# Patient Record
Sex: Male | Born: 1945 | Race: White | Hispanic: No | Marital: Married | Smoking: Former smoker
Health system: Southern US, Community
[De-identification: ages and names within clinical notes are randomized; demographics above are authoritative.]

## PROBLEM LIST (undated history)

## (undated) DIAGNOSIS — R569 Unspecified convulsions: Secondary | ICD-10-CM

## (undated) DIAGNOSIS — R413 Other amnesia: Secondary | ICD-10-CM

## (undated) DIAGNOSIS — M1990 Unspecified osteoarthritis, unspecified site: Secondary | ICD-10-CM

## (undated) HISTORY — DX: Unspecified convulsions: R56.9

## (undated) HISTORY — PX: SHOULDER SURGERY: SHX246

## (undated) HISTORY — DX: Unspecified osteoarthritis, unspecified site: M19.90

## (undated) HISTORY — DX: Other amnesia: R41.3

---

## 2003-12-03 ENCOUNTER — Ambulatory Visit (HOSPITAL_COMMUNITY): Admission: RE | Admit: 2003-12-03 | Discharge: 2003-12-03 | Payer: Self-pay | Admitting: Orthopedic Surgery

## 2005-10-21 IMAGING — CR DG ORBITS FOR FOREIGN BODY
2 series · 2 of 2 positions shown · non-contrast
Comparison: none

CLINICAL DATA: Pre-MRI/metal clearance. 

  ORBITS FOR FOREIGN BODY (TWO VIEWS)
 There is no evidence of metal or radiopaque foreign body within the orbits. No significant bone or soft tissue abnormalities are identified.
 IMPRESSION
 No evidence of metallic foreign body.

[view not recorded (1 of 2)]
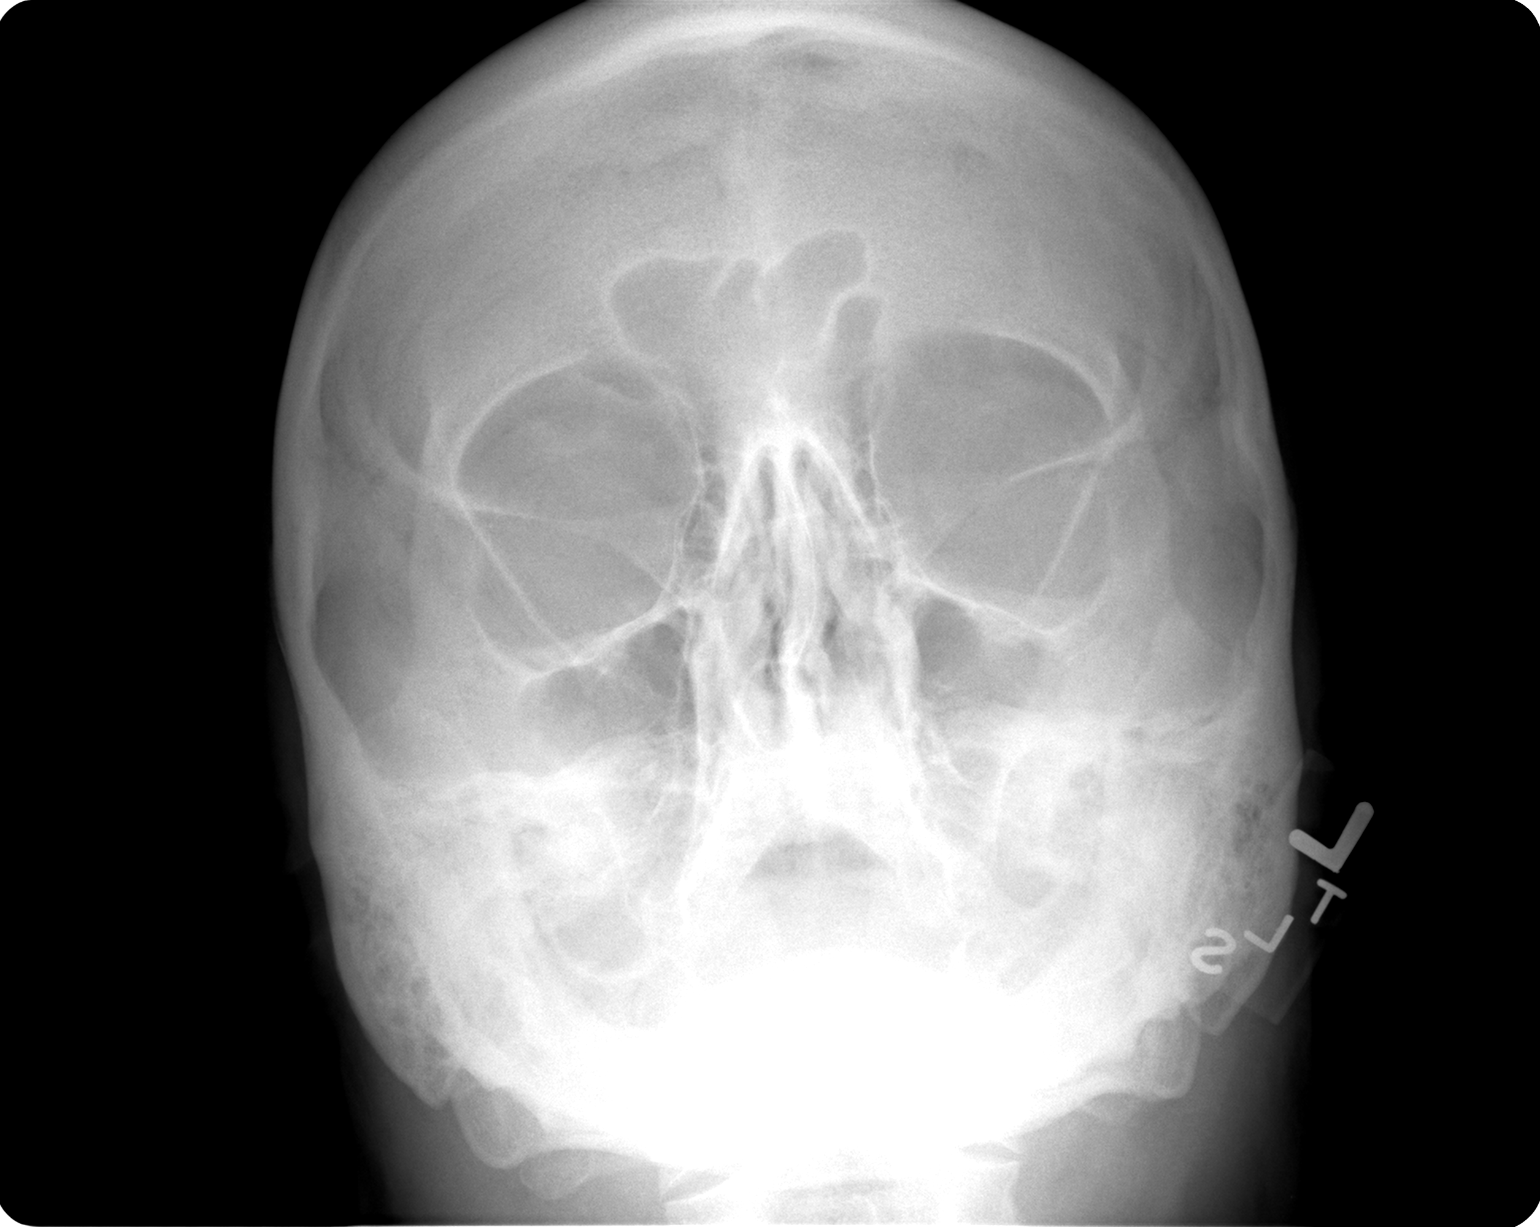

[view not recorded (2 of 2)]
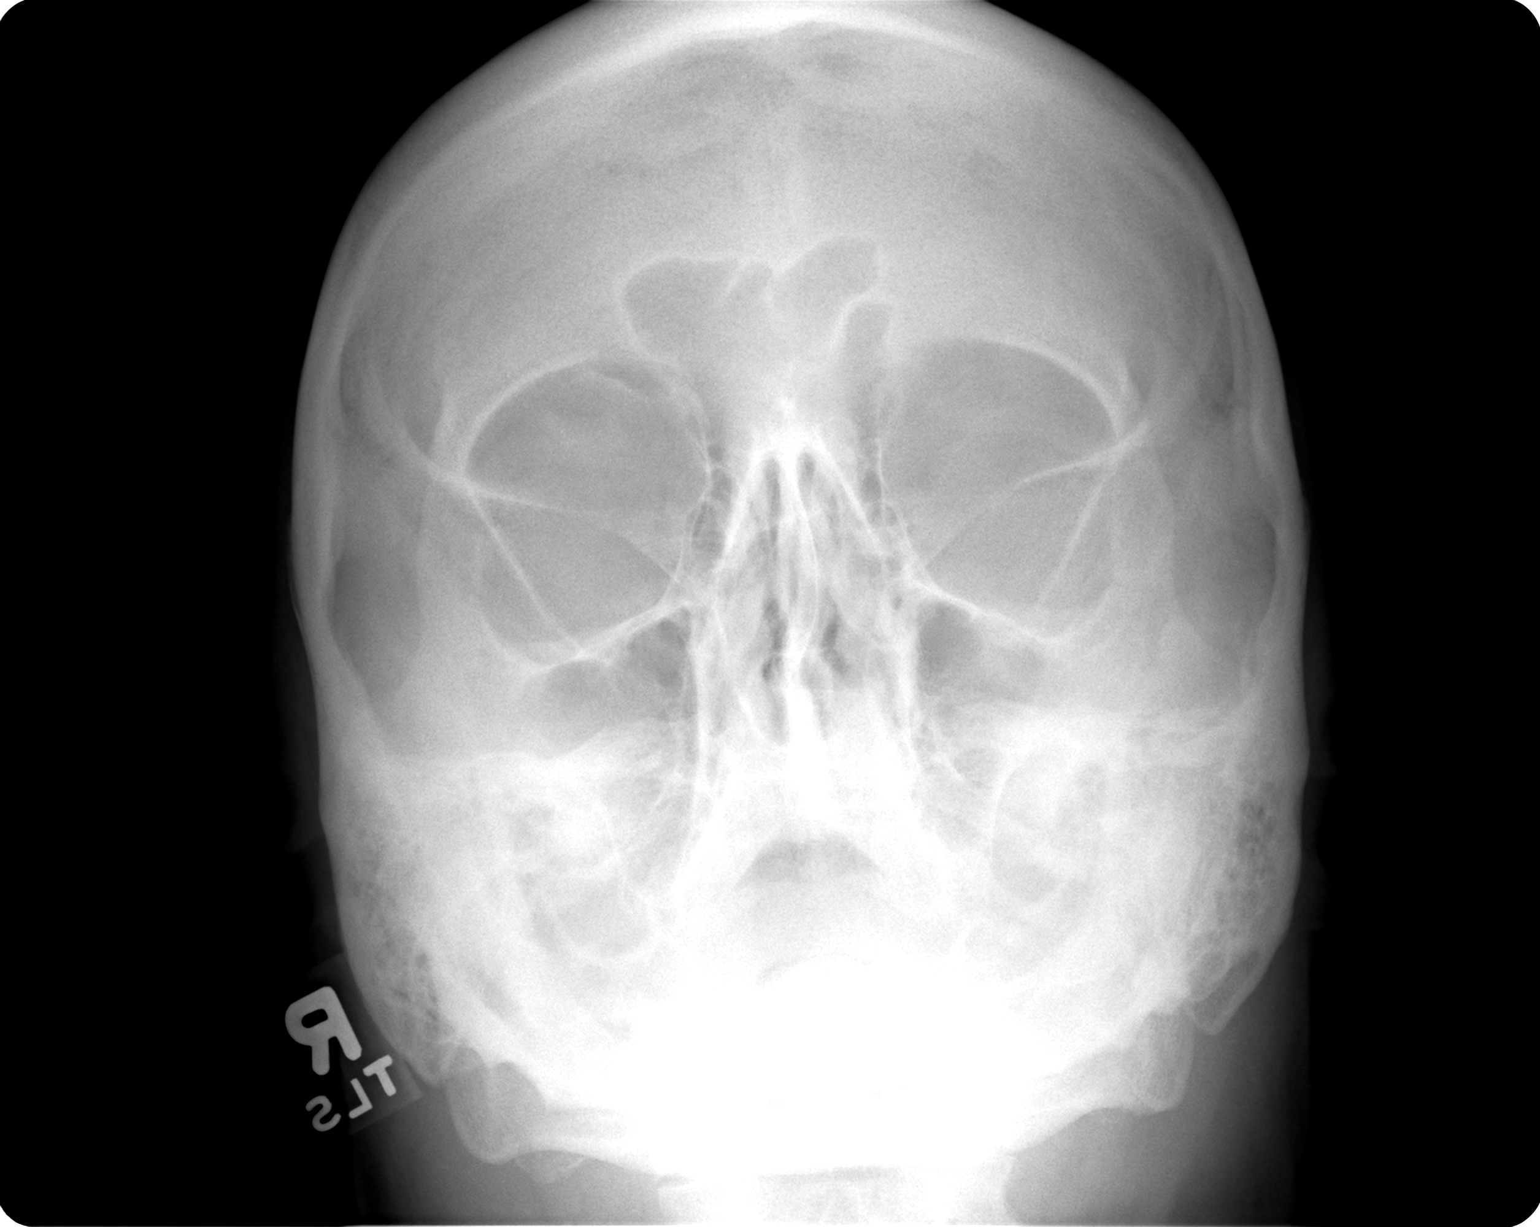

[2 of 2 positions shown; findings below may reference images not displayed]

## 2015-07-26 DIAGNOSIS — J069 Acute upper respiratory infection, unspecified: Secondary | ICD-10-CM | POA: Diagnosis not present

## 2015-07-26 DIAGNOSIS — R062 Wheezing: Secondary | ICD-10-CM | POA: Diagnosis not present

## 2015-07-26 DIAGNOSIS — J189 Pneumonia, unspecified organism: Secondary | ICD-10-CM | POA: Diagnosis not present

## 2015-07-26 DIAGNOSIS — H6123 Impacted cerumen, bilateral: Secondary | ICD-10-CM | POA: Diagnosis not present

## 2015-09-06 DIAGNOSIS — J181 Lobar pneumonia, unspecified organism: Secondary | ICD-10-CM | POA: Diagnosis not present

## 2015-09-17 DIAGNOSIS — R062 Wheezing: Secondary | ICD-10-CM | POA: Diagnosis not present

## 2015-09-17 DIAGNOSIS — J209 Acute bronchitis, unspecified: Secondary | ICD-10-CM | POA: Diagnosis not present

## 2015-10-28 DIAGNOSIS — I129 Hypertensive chronic kidney disease with stage 1 through stage 4 chronic kidney disease, or unspecified chronic kidney disease: Secondary | ICD-10-CM | POA: Diagnosis not present

## 2015-10-28 DIAGNOSIS — R5381 Other malaise: Secondary | ICD-10-CM | POA: Diagnosis not present

## 2015-10-28 DIAGNOSIS — R0989 Other specified symptoms and signs involving the circulatory and respiratory systems: Secondary | ICD-10-CM | POA: Diagnosis not present

## 2015-10-28 DIAGNOSIS — E039 Hypothyroidism, unspecified: Secondary | ICD-10-CM | POA: Diagnosis not present

## 2015-10-28 DIAGNOSIS — N183 Chronic kidney disease, stage 3 (moderate): Secondary | ICD-10-CM | POA: Diagnosis not present

## 2015-10-28 DIAGNOSIS — Z6829 Body mass index (BMI) 29.0-29.9, adult: Secondary | ICD-10-CM | POA: Diagnosis not present

## 2015-10-28 DIAGNOSIS — E782 Mixed hyperlipidemia: Secondary | ICD-10-CM | POA: Diagnosis not present

## 2015-10-28 DIAGNOSIS — R419 Unspecified symptoms and signs involving cognitive functions and awareness: Secondary | ICD-10-CM | POA: Diagnosis not present

## 2015-10-28 DIAGNOSIS — I7090 Unspecified atherosclerosis: Secondary | ICD-10-CM | POA: Diagnosis not present

## 2015-10-28 DIAGNOSIS — R5383 Other fatigue: Secondary | ICD-10-CM | POA: Diagnosis not present

## 2015-10-28 DIAGNOSIS — R413 Other amnesia: Secondary | ICD-10-CM | POA: Diagnosis not present

## 2015-10-28 DIAGNOSIS — E1122 Type 2 diabetes mellitus with diabetic chronic kidney disease: Secondary | ICD-10-CM | POA: Diagnosis not present

## 2015-10-28 DIAGNOSIS — E663 Overweight: Secondary | ICD-10-CM | POA: Diagnosis not present

## 2015-11-11 DIAGNOSIS — D519 Vitamin B12 deficiency anemia, unspecified: Secondary | ICD-10-CM | POA: Diagnosis not present

## 2015-11-11 DIAGNOSIS — D649 Anemia, unspecified: Secondary | ICD-10-CM | POA: Diagnosis not present

## 2015-11-25 DIAGNOSIS — I129 Hypertensive chronic kidney disease with stage 1 through stage 4 chronic kidney disease, or unspecified chronic kidney disease: Secondary | ICD-10-CM | POA: Diagnosis not present

## 2015-11-25 DIAGNOSIS — Z79899 Other long term (current) drug therapy: Secondary | ICD-10-CM | POA: Diagnosis not present

## 2015-11-25 DIAGNOSIS — R413 Other amnesia: Secondary | ICD-10-CM | POA: Diagnosis not present

## 2015-11-25 DIAGNOSIS — D509 Iron deficiency anemia, unspecified: Secondary | ICD-10-CM | POA: Diagnosis not present

## 2015-11-25 DIAGNOSIS — E782 Mixed hyperlipidemia: Secondary | ICD-10-CM | POA: Diagnosis not present

## 2015-11-25 DIAGNOSIS — R5381 Other malaise: Secondary | ICD-10-CM | POA: Diagnosis not present

## 2015-11-25 DIAGNOSIS — R5383 Other fatigue: Secondary | ICD-10-CM | POA: Diagnosis not present

## 2015-11-25 DIAGNOSIS — N183 Chronic kidney disease, stage 3 (moderate): Secondary | ICD-10-CM | POA: Diagnosis not present

## 2015-11-25 DIAGNOSIS — E663 Overweight: Secondary | ICD-10-CM | POA: Diagnosis not present

## 2015-11-25 DIAGNOSIS — E039 Hypothyroidism, unspecified: Secondary | ICD-10-CM | POA: Diagnosis not present

## 2015-11-25 DIAGNOSIS — Z6828 Body mass index (BMI) 28.0-28.9, adult: Secondary | ICD-10-CM | POA: Diagnosis not present

## 2015-11-25 DIAGNOSIS — E1122 Type 2 diabetes mellitus with diabetic chronic kidney disease: Secondary | ICD-10-CM | POA: Diagnosis not present

## 2015-11-29 DIAGNOSIS — R194 Change in bowel habit: Secondary | ICD-10-CM | POA: Diagnosis not present

## 2015-11-29 DIAGNOSIS — D509 Iron deficiency anemia, unspecified: Secondary | ICD-10-CM | POA: Diagnosis not present

## 2015-12-07 DIAGNOSIS — D509 Iron deficiency anemia, unspecified: Secondary | ICD-10-CM | POA: Diagnosis not present

## 2015-12-07 DIAGNOSIS — Z0000 Encounter for general adult medical examination without abnormal findings: Secondary | ICD-10-CM | POA: Diagnosis not present

## 2015-12-07 DIAGNOSIS — D123 Benign neoplasm of transverse colon: Secondary | ICD-10-CM | POA: Diagnosis not present

## 2016-01-29 DIAGNOSIS — R1031 Right lower quadrant pain: Secondary | ICD-10-CM | POA: Diagnosis not present

## 2016-01-29 DIAGNOSIS — K8020 Calculus of gallbladder without cholecystitis without obstruction: Secondary | ICD-10-CM | POA: Diagnosis not present

## 2016-01-29 DIAGNOSIS — K5732 Diverticulitis of large intestine without perforation or abscess without bleeding: Secondary | ICD-10-CM | POA: Diagnosis not present

## 2016-01-31 DIAGNOSIS — J323 Chronic sphenoidal sinusitis: Secondary | ICD-10-CM | POA: Diagnosis not present

## 2016-01-31 DIAGNOSIS — R413 Other amnesia: Secondary | ICD-10-CM | POA: Diagnosis not present

## 2016-02-04 DIAGNOSIS — K5732 Diverticulitis of large intestine without perforation or abscess without bleeding: Secondary | ICD-10-CM | POA: Diagnosis not present

## 2016-02-04 DIAGNOSIS — R1031 Right lower quadrant pain: Secondary | ICD-10-CM | POA: Diagnosis not present

## 2016-02-04 DIAGNOSIS — K5792 Diverticulitis of intestine, part unspecified, without perforation or abscess without bleeding: Secondary | ICD-10-CM | POA: Diagnosis not present

## 2016-02-08 DIAGNOSIS — F0280 Dementia in other diseases classified elsewhere without behavioral disturbance: Secondary | ICD-10-CM | POA: Diagnosis not present

## 2016-02-08 DIAGNOSIS — K5792 Diverticulitis of intestine, part unspecified, without perforation or abscess without bleeding: Secondary | ICD-10-CM | POA: Diagnosis not present

## 2016-02-08 DIAGNOSIS — D509 Iron deficiency anemia, unspecified: Secondary | ICD-10-CM | POA: Diagnosis not present

## 2016-02-08 DIAGNOSIS — G300 Alzheimer's disease with early onset: Secondary | ICD-10-CM | POA: Diagnosis not present

## 2016-02-08 DIAGNOSIS — Z79899 Other long term (current) drug therapy: Secondary | ICD-10-CM | POA: Diagnosis not present

## 2016-02-28 DIAGNOSIS — D649 Anemia, unspecified: Secondary | ICD-10-CM | POA: Diagnosis not present

## 2016-02-28 DIAGNOSIS — K5730 Diverticulosis of large intestine without perforation or abscess without bleeding: Secondary | ICD-10-CM | POA: Diagnosis not present

## 2016-02-28 DIAGNOSIS — K8020 Calculus of gallbladder without cholecystitis without obstruction: Secondary | ICD-10-CM | POA: Diagnosis not present

## 2016-03-20 DIAGNOSIS — K648 Other hemorrhoids: Secondary | ICD-10-CM | POA: Diagnosis not present

## 2016-03-20 DIAGNOSIS — R194 Change in bowel habit: Secondary | ICD-10-CM | POA: Diagnosis not present

## 2016-03-20 DIAGNOSIS — K5730 Diverticulosis of large intestine without perforation or abscess without bleeding: Secondary | ICD-10-CM | POA: Diagnosis not present

## 2016-03-20 DIAGNOSIS — R933 Abnormal findings on diagnostic imaging of other parts of digestive tract: Secondary | ICD-10-CM | POA: Diagnosis not present

## 2016-03-27 DIAGNOSIS — R938 Abnormal findings on diagnostic imaging of other specified body structures: Secondary | ICD-10-CM | POA: Diagnosis not present

## 2016-04-09 DIAGNOSIS — K7689 Other specified diseases of liver: Secondary | ICD-10-CM | POA: Diagnosis not present

## 2016-04-09 DIAGNOSIS — K8020 Calculus of gallbladder without cholecystitis without obstruction: Secondary | ICD-10-CM | POA: Diagnosis not present

## 2016-04-10 DIAGNOSIS — G300 Alzheimer's disease with early onset: Secondary | ICD-10-CM | POA: Diagnosis not present

## 2016-04-10 DIAGNOSIS — F0280 Dementia in other diseases classified elsewhere without behavioral disturbance: Secondary | ICD-10-CM | POA: Diagnosis not present

## 2016-04-10 DIAGNOSIS — Z79899 Other long term (current) drug therapy: Secondary | ICD-10-CM | POA: Diagnosis not present

## 2016-04-10 DIAGNOSIS — E782 Mixed hyperlipidemia: Secondary | ICD-10-CM | POA: Diagnosis not present

## 2016-04-10 DIAGNOSIS — I129 Hypertensive chronic kidney disease with stage 1 through stage 4 chronic kidney disease, or unspecified chronic kidney disease: Secondary | ICD-10-CM | POA: Diagnosis not present

## 2016-04-10 DIAGNOSIS — N183 Chronic kidney disease, stage 3 (moderate): Secondary | ICD-10-CM | POA: Diagnosis not present

## 2016-04-10 DIAGNOSIS — D509 Iron deficiency anemia, unspecified: Secondary | ICD-10-CM | POA: Diagnosis not present

## 2016-04-10 DIAGNOSIS — E039 Hypothyroidism, unspecified: Secondary | ICD-10-CM | POA: Diagnosis not present

## 2016-04-10 DIAGNOSIS — I7090 Unspecified atherosclerosis: Secondary | ICD-10-CM | POA: Diagnosis not present

## 2016-04-10 DIAGNOSIS — E1122 Type 2 diabetes mellitus with diabetic chronic kidney disease: Secondary | ICD-10-CM | POA: Diagnosis not present

## 2016-08-10 DIAGNOSIS — N183 Chronic kidney disease, stage 3 (moderate): Secondary | ICD-10-CM | POA: Diagnosis not present

## 2016-08-10 DIAGNOSIS — G300 Alzheimer's disease with early onset: Secondary | ICD-10-CM | POA: Diagnosis not present

## 2016-08-10 DIAGNOSIS — E1122 Type 2 diabetes mellitus with diabetic chronic kidney disease: Secondary | ICD-10-CM | POA: Diagnosis not present

## 2016-08-10 DIAGNOSIS — E782 Mixed hyperlipidemia: Secondary | ICD-10-CM | POA: Diagnosis not present

## 2016-08-10 DIAGNOSIS — E039 Hypothyroidism, unspecified: Secondary | ICD-10-CM | POA: Diagnosis not present

## 2016-08-10 DIAGNOSIS — F0280 Dementia in other diseases classified elsewhere without behavioral disturbance: Secondary | ICD-10-CM | POA: Diagnosis not present

## 2016-08-10 DIAGNOSIS — I7090 Unspecified atherosclerosis: Secondary | ICD-10-CM | POA: Diagnosis not present

## 2016-08-10 DIAGNOSIS — I129 Hypertensive chronic kidney disease with stage 1 through stage 4 chronic kidney disease, or unspecified chronic kidney disease: Secondary | ICD-10-CM | POA: Diagnosis not present

## 2016-08-10 DIAGNOSIS — Z79899 Other long term (current) drug therapy: Secondary | ICD-10-CM | POA: Diagnosis not present

## 2016-08-10 DIAGNOSIS — D509 Iron deficiency anemia, unspecified: Secondary | ICD-10-CM | POA: Diagnosis not present

## 2017-02-14 DIAGNOSIS — R5383 Other fatigue: Secondary | ICD-10-CM | POA: Diagnosis not present

## 2017-02-14 DIAGNOSIS — D509 Iron deficiency anemia, unspecified: Secondary | ICD-10-CM | POA: Diagnosis not present

## 2017-02-14 DIAGNOSIS — I129 Hypertensive chronic kidney disease with stage 1 through stage 4 chronic kidney disease, or unspecified chronic kidney disease: Secondary | ICD-10-CM | POA: Diagnosis not present

## 2017-02-14 DIAGNOSIS — I7090 Unspecified atherosclerosis: Secondary | ICD-10-CM | POA: Diagnosis not present

## 2017-02-14 DIAGNOSIS — G8929 Other chronic pain: Secondary | ICD-10-CM | POA: Diagnosis not present

## 2017-02-14 DIAGNOSIS — M545 Low back pain: Secondary | ICD-10-CM | POA: Diagnosis not present

## 2017-02-14 DIAGNOSIS — E1122 Type 2 diabetes mellitus with diabetic chronic kidney disease: Secondary | ICD-10-CM | POA: Diagnosis not present

## 2017-02-14 DIAGNOSIS — Z79899 Other long term (current) drug therapy: Secondary | ICD-10-CM | POA: Diagnosis not present

## 2017-02-14 DIAGNOSIS — N183 Chronic kidney disease, stage 3 (moderate): Secondary | ICD-10-CM | POA: Diagnosis not present

## 2017-02-14 DIAGNOSIS — R5381 Other malaise: Secondary | ICD-10-CM | POA: Diagnosis not present

## 2017-03-04 DIAGNOSIS — S46812A Strain of other muscles, fascia and tendons at shoulder and upper arm level, left arm, initial encounter: Secondary | ICD-10-CM | POA: Diagnosis not present

## 2017-03-04 DIAGNOSIS — F0390 Unspecified dementia without behavioral disturbance: Secondary | ICD-10-CM | POA: Diagnosis not present

## 2017-03-04 DIAGNOSIS — M25512 Pain in left shoulder: Secondary | ICD-10-CM | POA: Diagnosis not present

## 2017-03-04 DIAGNOSIS — I10 Essential (primary) hypertension: Secondary | ICD-10-CM | POA: Diagnosis not present

## 2017-03-04 DIAGNOSIS — M546 Pain in thoracic spine: Secondary | ICD-10-CM | POA: Diagnosis not present

## 2017-03-04 DIAGNOSIS — Z79899 Other long term (current) drug therapy: Secondary | ICD-10-CM | POA: Diagnosis not present

## 2017-03-04 DIAGNOSIS — R0602 Shortness of breath: Secondary | ICD-10-CM | POA: Diagnosis not present

## 2017-04-16 DIAGNOSIS — I129 Hypertensive chronic kidney disease with stage 1 through stage 4 chronic kidney disease, or unspecified chronic kidney disease: Secondary | ICD-10-CM | POA: Diagnosis not present

## 2017-04-16 DIAGNOSIS — E1122 Type 2 diabetes mellitus with diabetic chronic kidney disease: Secondary | ICD-10-CM | POA: Diagnosis not present

## 2017-04-16 DIAGNOSIS — N183 Chronic kidney disease, stage 3 (moderate): Secondary | ICD-10-CM | POA: Diagnosis not present

## 2017-04-16 DIAGNOSIS — Z79899 Other long term (current) drug therapy: Secondary | ICD-10-CM | POA: Diagnosis not present

## 2017-06-19 DIAGNOSIS — G300 Alzheimer's disease with early onset: Secondary | ICD-10-CM | POA: Diagnosis not present

## 2017-06-19 DIAGNOSIS — E1122 Type 2 diabetes mellitus with diabetic chronic kidney disease: Secondary | ICD-10-CM | POA: Diagnosis not present

## 2017-06-19 DIAGNOSIS — D509 Iron deficiency anemia, unspecified: Secondary | ICD-10-CM | POA: Diagnosis not present

## 2017-06-19 DIAGNOSIS — N183 Chronic kidney disease, stage 3 (moderate): Secondary | ICD-10-CM | POA: Diagnosis not present

## 2017-06-19 DIAGNOSIS — E039 Hypothyroidism, unspecified: Secondary | ICD-10-CM | POA: Diagnosis not present

## 2017-06-19 DIAGNOSIS — I129 Hypertensive chronic kidney disease with stage 1 through stage 4 chronic kidney disease, or unspecified chronic kidney disease: Secondary | ICD-10-CM | POA: Diagnosis not present

## 2017-06-19 DIAGNOSIS — E782 Mixed hyperlipidemia: Secondary | ICD-10-CM | POA: Diagnosis not present

## 2017-08-23 DIAGNOSIS — M542 Cervicalgia: Secondary | ICD-10-CM | POA: Diagnosis not present

## 2017-08-23 DIAGNOSIS — M545 Low back pain: Secondary | ICD-10-CM | POA: Diagnosis not present

## 2017-08-23 DIAGNOSIS — M25512 Pain in left shoulder: Secondary | ICD-10-CM | POA: Diagnosis not present

## 2017-08-23 DIAGNOSIS — M25562 Pain in left knee: Secondary | ICD-10-CM | POA: Diagnosis not present

## 2017-08-23 DIAGNOSIS — M25561 Pain in right knee: Secondary | ICD-10-CM | POA: Diagnosis not present

## 2017-08-31 DIAGNOSIS — M542 Cervicalgia: Secondary | ICD-10-CM | POA: Diagnosis not present

## 2017-09-06 DIAGNOSIS — M25512 Pain in left shoulder: Secondary | ICD-10-CM | POA: Diagnosis not present

## 2017-09-06 DIAGNOSIS — R569 Unspecified convulsions: Secondary | ICD-10-CM | POA: Diagnosis not present

## 2017-09-13 DIAGNOSIS — R569 Unspecified convulsions: Secondary | ICD-10-CM | POA: Diagnosis not present

## 2017-09-20 DIAGNOSIS — R569 Unspecified convulsions: Secondary | ICD-10-CM | POA: Diagnosis not present

## 2017-09-25 ENCOUNTER — Telehealth: Payer: Self-pay | Admitting: Neurology

## 2017-09-25 ENCOUNTER — Encounter: Payer: Self-pay | Admitting: Neurology

## 2017-09-25 ENCOUNTER — Ambulatory Visit (INDEPENDENT_AMBULATORY_CARE_PROVIDER_SITE_OTHER): Payer: PPO | Admitting: Neurology

## 2017-09-25 VITALS — BP 180/94 | HR 66 | Ht 70.0 in | Wt 196.0 lb

## 2017-09-25 DIAGNOSIS — R569 Unspecified convulsions: Secondary | ICD-10-CM | POA: Diagnosis not present

## 2017-09-25 MED ORDER — LEVETIRACETAM 500 MG PO TABS: 500.0000 mg | ORAL_TABLET | Freq: Two times a day (BID) | ORAL | 11 refills | Status: DC

## 2017-09-25 MED ORDER — LEVETIRACETAM 500 MG PO TABS: 500.0000 mg | ORAL_TABLET | Freq: Two times a day (BID) | ORAL | 11 refills | Status: AC

## 2017-09-25 NOTE — Progress Notes (Signed)
PATIENT: Javier Martinez DOB: [birth year]  Chief Complaint  Patient presents with  . Seizures    He is here with his wife, Lelon Frohlich.  Reports having a seizure in mid-June.  His wife witnessed the event and saw the following:  jerking in arms/legs, gasping for air, eyes rolled toward back of head.  Symptoms only lasted around one minute.  No further episodes have occurred but he does have a history of  several unexplained passing out events.  . Orthopaedics    Latanya Maudlin, MD (referring provider)  . PCP    Raina Mina., MD (treats his memory loss)     HISTORICAL  Javier Martinez is a 72 years old male, seen in refer by his orthopedic surgeon Dr. Latanya Maudlin, and his primary care physician Dr.Gioffre, Jori Moll, for evaluation of seizure, initial evaluation was on September 25, 2017.  I have reviewed and summarized the referring note, he has past medical history of hypertension, hearing loss, he used to work at [employer] was loud noise, he presented with seizure,  On evening of August 31, 2017, his wife noticed him had sudden onset making snoring noises, eyes rolled back, body jerking movement, lasting for few minutes, had no recollection of the event,  He had a history of high fever around 2009, at that time, he was noted to have visual and auditory hallucinations, paramedic was called, but his fever has improved by then, he refused to be treated at the hospital, but ever since then, he had recurrent spells of passing out, sudden onset loss of consciousness.  It happened once every month, sometimes preceded by feeling dizzy.  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  ALLERGIES: Allergies  Allergen Reactions  . Other Anaphylaxis and Other (See Comments)    Unknown     HOME MEDICATIONS: Current Outpatient Medications  Medication Sig Dispense Refill  . Acetaminophen (ARTHRITIS PAIN PO) Take by mouth as needed.    . donepezil (ARICEPT) 10 MG tablet Take 10 mg by mouth at bedtime.  2   No current facility-administered medications for this visit.     PAST MEDICAL HISTORY: Past Medical History:  Diagnosis Date  . Arthritis   . Memory loss   . Seizure ([place])     PAST SURGICAL HISTORY: Past Surgical History:  Procedure Laterality Date  . SHOULDER SURGERY      FAMILY HISTORY: Family History  Problem Relation Age of Onset  . Heart disease Mother   . Heart attack Father     SOCIAL HISTORY:  Social History   Socioeconomic History  . Marital status: Married    Spouse name: Not on file  . Number of children: 1  . Years of education: 11  . Highest education level: High school graduate  Occupational History  . Occupation: Retired  Scientific laboratory technician  . Financial resource strain: Not on file  . Food insecurity:    Worry: Not on file    Inability: Not on file  . Transportation needs:    Medical: Not on file    Non-medical: Not on file  Tobacco Use  . Smoking status: Former Research scientist (life sciences)  .  Smokeless tobacco: Never Used  . Tobacco comment: stopped 30+ years ago  Substance and Sexual Activity  . Alcohol use: Not Currently  . Drug use: Never  . Sexual activity: Not on file  Lifestyle  . Physical activity:    Days per week: Not on file    Minutes per session: Not on file  . Stress: Not on file  Relationships  . Social connections:    Talks on phone: Not on file    Gets together: Not on file    Attends religious service: Not on file    Active member of club or organization: Not on file    Attends meetings of clubs or organizations: Not on file    Relationship status: Not on  file  . Intimate partner violence:    Fear of current or ex partner: Not on file    Emotionally abused: Not on file    Physically abused: Not on file    Forced sexual activity: Not on file  Other Topics Concern  . Not on file  Social History Narrative   Lives at home with his wife.   Occasional caffeine.   Right-handed.   He has one biological child and three stepchildren.       PHYSICAL EXAM   Vitals:   09/25/17 0938  BP: (!) 180/94  Pulse: 66  Weight: 196 lb (88.9 kg)  Height: 5\' 10"  (1.778 m)    Not recorded      Body mass index is 28.12 kg/m.  PHYSICAL EXAMNIATION:  Gen: NAD, conversant, well nourised, obese, well groomed                     Cardiovascular: Regular rate rhythm, no peripheral edema, warm, nontender. Eyes: Conjunctivae clear without exudates or hemorrhage Neck: Supple, no carotid bruits. Pulmonary: Clear to auscultation bilaterally   NEUROLOGICAL EXAM:  MENTAL STATUS: Speech:    Speech is normal; fluent and spontaneous with normal comprehension.  Cognition:     Orientation to time, place and person     Normal recent and remote memory     Normal Attention span and concentration     Normal Language, naming, repeating,spontaneous speech     Fund of knowledge   CRANIAL NERVES: CN II: Visual fields are full to confrontation. Fundoscopic exam is normal with sharp discs and no vascular changes. Pupils are round equal and briskly reactive to light. CN III, IV, VI: extraocular movement are normal. No ptosis. CN V: Facial sensation is intact to pinprick in all 3 divisions bilaterally. Corneal responses are intact.  CN VII: Face is symmetric with normal eye closure and smile. CN VIII: Hard of hearing CN IX, X: Palate elevates symmetrically. Phonation is normal. CN XI: Head turning and shoulder shrug are intact CN XII: Tongue is midline with normal movements and no atrophy.  MOTOR: There is no pronator drift of out-stretched arms. Muscle bulk and  tone are normal. Muscle strength is normal.  REFLEXES: Reflexes are 2+ and symmetric at the biceps, triceps, knees, and ankles. Plantar responses are flexor.  SENSORY: Intact to light touch, pinprick, positional sensation and vibratory sensation are intact in fingers and toes.  COORDINATION: Rapid alternating movements and fine finger movements are intact. There is no dysmetria on finger-to-nose and heel-knee-shin.    GAIT/STANCE: Posture is normal. Gait is steady with normal steps, base, arm swing, and turning. Heel and toe walking are normal. Tandem gait is normal.  Romberg is absent.   DIAGNOSTIC  DATA (LABS, IMAGING, TESTING) - I reviewed patient records, labs, notes, testing and imaging myself where available.   ASSESSMENT AND PLAN  Javier Martinez is a 72 y.o. male   Seizure  Most recent one was on August 31, 2017, no driving until seizure-free for 6 months  EEG  Keppra 500mg  bid  Calcium with vitamin D daily supplement  Wife stated, patient is likely not compliant with his doctor's appointment or medications,   Marcial Pacas, M.D. Ph.D.  [facility] [address], [place], [place] [address] Ph: [phone] Fax: [phone]  CC: Latanya Maudlin, MD

## 2017-09-25 NOTE — Telephone Encounter (Signed)
Patient was seen today and at check-out stated that he does not see the point of further appointments or testing. I informed the patient that was his decision and to call back if he changed his mind.

## 2018-02-04 DIAGNOSIS — I10 Essential (primary) hypertension: Secondary | ICD-10-CM | POA: Diagnosis not present

## 2018-02-04 DIAGNOSIS — R079 Chest pain, unspecified: Secondary | ICD-10-CM | POA: Diagnosis not present

## 2018-02-04 DIAGNOSIS — Z23 Encounter for immunization: Secondary | ICD-10-CM | POA: Diagnosis not present

## 2018-02-04 DIAGNOSIS — I491 Atrial premature depolarization: Secondary | ICD-10-CM | POA: Diagnosis not present

## 2018-02-04 DIAGNOSIS — Z8639 Personal history of other endocrine, nutritional and metabolic disease: Secondary | ICD-10-CM | POA: Diagnosis not present

## 2018-02-04 DIAGNOSIS — E039 Hypothyroidism, unspecified: Secondary | ICD-10-CM | POA: Diagnosis not present

## 2018-02-04 DIAGNOSIS — R0789 Other chest pain: Secondary | ICD-10-CM | POA: Diagnosis not present

## 2018-02-04 DIAGNOSIS — E876 Hypokalemia: Secondary | ICD-10-CM | POA: Diagnosis not present

## 2018-02-04 DIAGNOSIS — I5023 Acute on chronic systolic (congestive) heart failure: Secondary | ICD-10-CM | POA: Diagnosis not present

## 2018-02-04 DIAGNOSIS — G300 Alzheimer's disease with early onset: Secondary | ICD-10-CM | POA: Diagnosis not present

## 2018-02-04 DIAGNOSIS — F0280 Dementia in other diseases classified elsewhere without behavioral disturbance: Secondary | ICD-10-CM | POA: Diagnosis not present

## 2018-02-04 DIAGNOSIS — I214 Non-ST elevation (NSTEMI) myocardial infarction: Secondary | ICD-10-CM | POA: Diagnosis not present

## 2018-02-04 DIAGNOSIS — F0390 Unspecified dementia without behavioral disturbance: Secondary | ICD-10-CM | POA: Diagnosis not present

## 2018-02-04 DIAGNOSIS — R072 Precordial pain: Secondary | ICD-10-CM | POA: Diagnosis not present

## 2018-02-04 DIAGNOSIS — I509 Heart failure, unspecified: Secondary | ICD-10-CM | POA: Diagnosis not present

## 2018-02-04 DIAGNOSIS — R7989 Other specified abnormal findings of blood chemistry: Secondary | ICD-10-CM | POA: Diagnosis not present

## 2018-02-04 DIAGNOSIS — I160 Hypertensive urgency: Secondary | ICD-10-CM | POA: Diagnosis not present

## 2018-02-04 DIAGNOSIS — E119 Type 2 diabetes mellitus without complications: Secondary | ICD-10-CM | POA: Diagnosis not present

## 2018-02-04 DIAGNOSIS — R0602 Shortness of breath: Secondary | ICD-10-CM | POA: Diagnosis not present

## 2018-02-04 DIAGNOSIS — G309 Alzheimer's disease, unspecified: Secondary | ICD-10-CM | POA: Diagnosis not present

## 2018-02-04 DIAGNOSIS — Z87891 Personal history of nicotine dependence: Secondary | ICD-10-CM | POA: Diagnosis not present

## 2018-02-04 DIAGNOSIS — I110 Hypertensive heart disease with heart failure: Secondary | ICD-10-CM | POA: Diagnosis not present

## 2018-02-04 DIAGNOSIS — R112 Nausea with vomiting, unspecified: Secondary | ICD-10-CM | POA: Diagnosis not present

## 2018-02-04 DIAGNOSIS — Z79899 Other long term (current) drug therapy: Secondary | ICD-10-CM | POA: Diagnosis not present

## 2018-02-04 DIAGNOSIS — E782 Mixed hyperlipidemia: Secondary | ICD-10-CM | POA: Diagnosis not present

## 2018-02-05 DIAGNOSIS — F0390 Unspecified dementia without behavioral disturbance: Secondary | ICD-10-CM | POA: Diagnosis not present

## 2018-02-05 DIAGNOSIS — Z8639 Personal history of other endocrine, nutritional and metabolic disease: Secondary | ICD-10-CM | POA: Diagnosis not present

## 2018-02-05 DIAGNOSIS — R7989 Other specified abnormal findings of blood chemistry: Secondary | ICD-10-CM | POA: Diagnosis not present

## 2018-02-05 DIAGNOSIS — R112 Nausea with vomiting, unspecified: Secondary | ICD-10-CM | POA: Diagnosis not present

## 2018-02-05 DIAGNOSIS — E119 Type 2 diabetes mellitus without complications: Secondary | ICD-10-CM | POA: Diagnosis not present

## 2018-02-05 DIAGNOSIS — G309 Alzheimer's disease, unspecified: Secondary | ICD-10-CM | POA: Diagnosis not present

## 2018-02-05 DIAGNOSIS — R079 Chest pain, unspecified: Secondary | ICD-10-CM | POA: Diagnosis not present

## 2018-02-05 DIAGNOSIS — I214 Non-ST elevation (NSTEMI) myocardial infarction: Secondary | ICD-10-CM | POA: Diagnosis not present

## 2018-02-05 DIAGNOSIS — I10 Essential (primary) hypertension: Secondary | ICD-10-CM | POA: Diagnosis not present

## 2018-02-05 DIAGNOSIS — R9431 Abnormal electrocardiogram [ECG] [EKG]: Secondary | ICD-10-CM | POA: Diagnosis not present

## 2018-02-05 DIAGNOSIS — J810 Acute pulmonary edema: Secondary | ICD-10-CM | POA: Diagnosis not present

## 2018-02-05 DIAGNOSIS — F0281 Dementia in other diseases classified elsewhere with behavioral disturbance: Secondary | ICD-10-CM | POA: Diagnosis not present

## 2018-02-05 DIAGNOSIS — E785 Hyperlipidemia, unspecified: Secondary | ICD-10-CM | POA: Diagnosis not present

## 2018-02-07 ENCOUNTER — Other Ambulatory Visit: Payer: Self-pay

## 2018-02-07 DIAGNOSIS — Z09 Encounter for follow-up examination after completed treatment for conditions other than malignant neoplasm: Secondary | ICD-10-CM | POA: Diagnosis not present

## 2018-02-07 NOTE — Patient Outreach (Signed)
[facility]) Care Management  02/07/2018  Javier Martinez Aug 25, 1945 [id number]  Transition of care  Referral date: 02/07/18 Referral source: discharged from [facility] on 02/05/18 Insurance: HTA  Telephone call to patient regarding transition of care referral. Patient states he is not interested. Patient would not confirm HIPAA or allow RNCM to explain reason for call.  Patient hung up the phone after stating he was not interested.   PLAN; RNCM will close patient due to patient refusing services.  RNCM will send patient [facility] care management brochure/ magnet RNCm will send patients primary MD closure letter.   Quinn Plowman RN,BSN,CCM [facility] Telephonic  [phone]

## 2018-02-15 DIAGNOSIS — A084 Viral intestinal infection, unspecified: Secondary | ICD-10-CM | POA: Diagnosis not present

## 2018-02-17 DIAGNOSIS — R112 Nausea with vomiting, unspecified: Secondary | ICD-10-CM | POA: Diagnosis not present

## 2018-02-17 DIAGNOSIS — N453 Epididymo-orchitis: Secondary | ICD-10-CM | POA: Diagnosis not present

## 2018-02-17 DIAGNOSIS — N50811 Right testicular pain: Secondary | ICD-10-CM | POA: Diagnosis not present

## 2018-02-24 DIAGNOSIS — R0789 Other chest pain: Secondary | ICD-10-CM | POA: Diagnosis not present

## 2018-02-24 DIAGNOSIS — I10 Essential (primary) hypertension: Secondary | ICD-10-CM | POA: Diagnosis not present

## 2018-02-24 DIAGNOSIS — M79604 Pain in right leg: Secondary | ICD-10-CM | POA: Diagnosis not present

## 2018-02-24 DIAGNOSIS — E782 Mixed hyperlipidemia: Secondary | ICD-10-CM | POA: Diagnosis not present

## 2018-02-24 DIAGNOSIS — M79605 Pain in left leg: Secondary | ICD-10-CM | POA: Diagnosis not present

## 2018-03-07 DIAGNOSIS — N453 Epididymo-orchitis: Secondary | ICD-10-CM | POA: Diagnosis not present

## 2018-03-07 DIAGNOSIS — S60512A Abrasion of left hand, initial encounter: Secondary | ICD-10-CM | POA: Diagnosis not present

## 2018-03-07 DIAGNOSIS — E1122 Type 2 diabetes mellitus with diabetic chronic kidney disease: Secondary | ICD-10-CM | POA: Diagnosis not present

## 2018-03-07 DIAGNOSIS — I130 Hypertensive heart and chronic kidney disease with heart failure and stage 1 through stage 4 chronic kidney disease, or unspecified chronic kidney disease: Secondary | ICD-10-CM | POA: Diagnosis not present

## 2018-03-07 DIAGNOSIS — E039 Hypothyroidism, unspecified: Secondary | ICD-10-CM | POA: Diagnosis not present

## 2018-03-07 DIAGNOSIS — I509 Heart failure, unspecified: Secondary | ICD-10-CM | POA: Diagnosis not present

## 2018-03-07 DIAGNOSIS — I25118 Atherosclerotic heart disease of native coronary artery with other forms of angina pectoris: Secondary | ICD-10-CM | POA: Diagnosis not present

## 2018-03-07 DIAGNOSIS — Z79899 Other long term (current) drug therapy: Secondary | ICD-10-CM | POA: Diagnosis not present

## 2018-03-07 DIAGNOSIS — F0280 Dementia in other diseases classified elsewhere without behavioral disturbance: Secondary | ICD-10-CM | POA: Diagnosis not present

## 2018-03-07 DIAGNOSIS — N183 Chronic kidney disease, stage 3 (moderate): Secondary | ICD-10-CM | POA: Diagnosis not present

## 2018-03-07 DIAGNOSIS — E782 Mixed hyperlipidemia: Secondary | ICD-10-CM | POA: Diagnosis not present

## 2018-03-07 DIAGNOSIS — N442 Benign cyst of testis: Secondary | ICD-10-CM | POA: Diagnosis not present

## 2018-03-07 DIAGNOSIS — G300 Alzheimer's disease with early onset: Secondary | ICD-10-CM | POA: Diagnosis not present

## 2018-04-09 DIAGNOSIS — R0981 Nasal congestion: Secondary | ICD-10-CM | POA: Diagnosis not present

## 2018-04-09 DIAGNOSIS — R42 Dizziness and giddiness: Secondary | ICD-10-CM | POA: Diagnosis not present

## 2018-04-14 DIAGNOSIS — E782 Mixed hyperlipidemia: Secondary | ICD-10-CM | POA: Diagnosis not present

## 2018-04-14 DIAGNOSIS — M79605 Pain in left leg: Secondary | ICD-10-CM | POA: Diagnosis not present

## 2018-04-14 DIAGNOSIS — R9439 Abnormal result of other cardiovascular function study: Secondary | ICD-10-CM | POA: Diagnosis not present

## 2018-04-14 DIAGNOSIS — I10 Essential (primary) hypertension: Secondary | ICD-10-CM | POA: Diagnosis not present

## 2018-04-14 DIAGNOSIS — R0789 Other chest pain: Secondary | ICD-10-CM | POA: Diagnosis not present

## 2018-04-14 DIAGNOSIS — M79604 Pain in right leg: Secondary | ICD-10-CM | POA: Diagnosis not present

## 2018-05-15 DIAGNOSIS — M79605 Pain in left leg: Secondary | ICD-10-CM | POA: Diagnosis not present

## 2018-05-15 DIAGNOSIS — M79604 Pain in right leg: Secondary | ICD-10-CM | POA: Diagnosis not present

## 2018-06-22 DIAGNOSIS — R509 Fever, unspecified: Secondary | ICD-10-CM | POA: Diagnosis not present

## 2018-06-22 DIAGNOSIS — Z79899 Other long term (current) drug therapy: Secondary | ICD-10-CM | POA: Diagnosis not present

## 2018-06-22 DIAGNOSIS — I10 Essential (primary) hypertension: Secondary | ICD-10-CM | POA: Diagnosis not present

## 2018-06-22 DIAGNOSIS — R8290 Unspecified abnormal findings in urine: Secondary | ICD-10-CM | POA: Diagnosis not present

## 2018-06-22 DIAGNOSIS — Z96641 Presence of right artificial hip joint: Secondary | ICD-10-CM | POA: Diagnosis not present

## 2018-06-22 DIAGNOSIS — Z7901 Long term (current) use of anticoagulants: Secondary | ICD-10-CM | POA: Diagnosis not present

## 2018-06-22 DIAGNOSIS — Z87891 Personal history of nicotine dependence: Secondary | ICD-10-CM | POA: Diagnosis not present

## 2018-06-22 DIAGNOSIS — Z8709 Personal history of other diseases of the respiratory system: Secondary | ICD-10-CM | POA: Diagnosis not present

## 2018-07-14 DIAGNOSIS — F0280 Dementia in other diseases classified elsewhere without behavioral disturbance: Secondary | ICD-10-CM | POA: Diagnosis not present

## 2018-07-14 DIAGNOSIS — R0789 Other chest pain: Secondary | ICD-10-CM | POA: Diagnosis not present

## 2018-07-14 DIAGNOSIS — E782 Mixed hyperlipidemia: Secondary | ICD-10-CM | POA: Diagnosis not present

## 2018-07-14 DIAGNOSIS — I10 Essential (primary) hypertension: Secondary | ICD-10-CM | POA: Diagnosis not present

## 2018-07-14 DIAGNOSIS — G300 Alzheimer's disease with early onset: Secondary | ICD-10-CM | POA: Diagnosis not present

## 2018-07-14 DIAGNOSIS — R9439 Abnormal result of other cardiovascular function study: Secondary | ICD-10-CM | POA: Diagnosis not present

## 2018-10-30 DIAGNOSIS — R079 Chest pain, unspecified: Secondary | ICD-10-CM | POA: Diagnosis not present

## 2018-10-30 DIAGNOSIS — K219 Gastro-esophageal reflux disease without esophagitis: Secondary | ICD-10-CM | POA: Diagnosis not present

## 2018-10-30 DIAGNOSIS — F0390 Unspecified dementia without behavioral disturbance: Secondary | ICD-10-CM | POA: Diagnosis not present

## 2018-10-30 DIAGNOSIS — R0789 Other chest pain: Secondary | ICD-10-CM | POA: Diagnosis not present

## 2018-11-08 DIAGNOSIS — E876 Hypokalemia: Secondary | ICD-10-CM | POA: Diagnosis not present

## 2018-11-08 DIAGNOSIS — R1111 Vomiting without nausea: Secondary | ICD-10-CM | POA: Diagnosis not present

## 2018-11-08 DIAGNOSIS — I130 Hypertensive heart and chronic kidney disease with heart failure and stage 1 through stage 4 chronic kidney disease, or unspecified chronic kidney disease: Secondary | ICD-10-CM

## 2018-11-08 DIAGNOSIS — I10 Essential (primary) hypertension: Secondary | ICD-10-CM | POA: Diagnosis not present

## 2018-11-08 DIAGNOSIS — R112 Nausea with vomiting, unspecified: Secondary | ICD-10-CM | POA: Diagnosis not present

## 2018-11-08 DIAGNOSIS — I493 Ventricular premature depolarization: Secondary | ICD-10-CM | POA: Diagnosis not present

## 2018-11-08 DIAGNOSIS — E785 Hyperlipidemia, unspecified: Secondary | ICD-10-CM | POA: Diagnosis not present

## 2018-11-08 DIAGNOSIS — R197 Diarrhea, unspecified: Secondary | ICD-10-CM | POA: Diagnosis not present

## 2018-11-08 DIAGNOSIS — I2510 Atherosclerotic heart disease of native coronary artery without angina pectoris: Secondary | ICD-10-CM | POA: Diagnosis not present

## 2018-11-08 DIAGNOSIS — I240 Acute coronary thrombosis not resulting in myocardial infarction: Secondary | ICD-10-CM | POA: Diagnosis not present

## 2018-11-08 DIAGNOSIS — R531 Weakness: Secondary | ICD-10-CM

## 2018-11-08 DIAGNOSIS — I361 Nonrheumatic tricuspid (valve) insufficiency: Secondary | ICD-10-CM | POA: Diagnosis not present

## 2018-11-08 DIAGNOSIS — E860 Dehydration: Secondary | ICD-10-CM | POA: Diagnosis not present

## 2018-11-08 DIAGNOSIS — K219 Gastro-esophageal reflux disease without esophagitis: Secondary | ICD-10-CM | POA: Diagnosis not present

## 2018-11-08 DIAGNOSIS — I513 Intracardiac thrombosis, not elsewhere classified: Secondary | ICD-10-CM | POA: Diagnosis not present

## 2018-11-08 DIAGNOSIS — E119 Type 2 diabetes mellitus without complications: Secondary | ICD-10-CM | POA: Diagnosis not present

## 2018-11-08 DIAGNOSIS — I5032 Chronic diastolic (congestive) heart failure: Secondary | ICD-10-CM

## 2018-11-08 DIAGNOSIS — R1110 Vomiting, unspecified: Secondary | ICD-10-CM | POA: Diagnosis not present

## 2018-11-08 DIAGNOSIS — R000 Tachycardia, unspecified: Secondary | ICD-10-CM | POA: Diagnosis not present

## 2018-11-08 DIAGNOSIS — R109 Unspecified abdominal pain: Secondary | ICD-10-CM

## 2018-11-08 DIAGNOSIS — Z79899 Other long term (current) drug therapy: Secondary | ICD-10-CM | POA: Diagnosis not present

## 2018-11-08 DIAGNOSIS — R0902 Hypoxemia: Secondary | ICD-10-CM | POA: Diagnosis not present

## 2018-11-08 DIAGNOSIS — R9431 Abnormal electrocardiogram [ECG] [EKG]: Secondary | ICD-10-CM | POA: Diagnosis not present

## 2018-11-08 DIAGNOSIS — N179 Acute kidney failure, unspecified: Secondary | ICD-10-CM

## 2018-11-08 DIAGNOSIS — R079 Chest pain, unspecified: Secondary | ICD-10-CM | POA: Diagnosis not present

## 2018-11-08 DIAGNOSIS — R778 Other specified abnormalities of plasma proteins: Secondary | ICD-10-CM | POA: Diagnosis not present

## 2018-11-08 DIAGNOSIS — F0390 Unspecified dementia without behavioral disturbance: Secondary | ICD-10-CM | POA: Diagnosis not present

## 2018-11-08 DIAGNOSIS — R410 Disorientation, unspecified: Secondary | ICD-10-CM | POA: Diagnosis not present

## 2018-11-09 DIAGNOSIS — R109 Unspecified abdominal pain: Secondary | ICD-10-CM | POA: Diagnosis not present

## 2018-11-09 DIAGNOSIS — I2510 Atherosclerotic heart disease of native coronary artery without angina pectoris: Secondary | ICD-10-CM | POA: Diagnosis not present

## 2018-11-09 DIAGNOSIS — E876 Hypokalemia: Secondary | ICD-10-CM | POA: Diagnosis not present

## 2018-11-09 DIAGNOSIS — N179 Acute kidney failure, unspecified: Secondary | ICD-10-CM | POA: Diagnosis not present

## 2018-11-09 DIAGNOSIS — F0390 Unspecified dementia without behavioral disturbance: Secondary | ICD-10-CM | POA: Diagnosis not present

## 2018-11-09 DIAGNOSIS — I493 Ventricular premature depolarization: Secondary | ICD-10-CM | POA: Diagnosis not present

## 2018-11-09 DIAGNOSIS — I513 Intracardiac thrombosis, not elsewhere classified: Secondary | ICD-10-CM | POA: Diagnosis not present

## 2018-11-09 DIAGNOSIS — I130 Hypertensive heart and chronic kidney disease with heart failure and stage 1 through stage 4 chronic kidney disease, or unspecified chronic kidney disease: Secondary | ICD-10-CM | POA: Diagnosis not present

## 2018-11-09 DIAGNOSIS — I5032 Chronic diastolic (congestive) heart failure: Secondary | ICD-10-CM | POA: Diagnosis not present

## 2018-11-09 DIAGNOSIS — I361 Nonrheumatic tricuspid (valve) insufficiency: Secondary | ICD-10-CM | POA: Diagnosis not present

## 2018-11-09 DIAGNOSIS — R079 Chest pain, unspecified: Secondary | ICD-10-CM | POA: Diagnosis not present

## 2018-11-09 DIAGNOSIS — R9431 Abnormal electrocardiogram [ECG] [EKG]: Secondary | ICD-10-CM | POA: Diagnosis not present

## 2018-11-09 DIAGNOSIS — R531 Weakness: Secondary | ICD-10-CM | POA: Diagnosis not present

## 2018-11-09 DIAGNOSIS — R197 Diarrhea, unspecified: Secondary | ICD-10-CM | POA: Diagnosis not present

## 2018-11-09 DIAGNOSIS — R112 Nausea with vomiting, unspecified: Secondary | ICD-10-CM | POA: Diagnosis not present

## 2018-11-10 DIAGNOSIS — I513 Intracardiac thrombosis, not elsewhere classified: Secondary | ICD-10-CM | POA: Diagnosis not present

## 2018-11-10 DIAGNOSIS — I493 Ventricular premature depolarization: Secondary | ICD-10-CM | POA: Diagnosis not present

## 2018-11-10 DIAGNOSIS — R531 Weakness: Secondary | ICD-10-CM | POA: Diagnosis not present

## 2018-11-10 DIAGNOSIS — R197 Diarrhea, unspecified: Secondary | ICD-10-CM | POA: Diagnosis not present

## 2018-11-10 DIAGNOSIS — N179 Acute kidney failure, unspecified: Secondary | ICD-10-CM | POA: Diagnosis not present

## 2018-11-10 DIAGNOSIS — F0390 Unspecified dementia without behavioral disturbance: Secondary | ICD-10-CM | POA: Diagnosis not present

## 2018-11-10 DIAGNOSIS — E876 Hypokalemia: Secondary | ICD-10-CM | POA: Diagnosis not present

## 2018-11-10 DIAGNOSIS — R109 Unspecified abdominal pain: Secondary | ICD-10-CM | POA: Diagnosis not present

## 2018-11-10 DIAGNOSIS — I2510 Atherosclerotic heart disease of native coronary artery without angina pectoris: Secondary | ICD-10-CM | POA: Diagnosis not present

## 2018-11-10 DIAGNOSIS — R079 Chest pain, unspecified: Secondary | ICD-10-CM | POA: Diagnosis not present

## 2018-11-10 DIAGNOSIS — R9431 Abnormal electrocardiogram [ECG] [EKG]: Secondary | ICD-10-CM | POA: Diagnosis not present

## 2018-11-10 DIAGNOSIS — I5032 Chronic diastolic (congestive) heart failure: Secondary | ICD-10-CM | POA: Diagnosis not present

## 2018-11-10 DIAGNOSIS — I130 Hypertensive heart and chronic kidney disease with heart failure and stage 1 through stage 4 chronic kidney disease, or unspecified chronic kidney disease: Secondary | ICD-10-CM | POA: Diagnosis not present

## 2018-11-10 DIAGNOSIS — I361 Nonrheumatic tricuspid (valve) insufficiency: Secondary | ICD-10-CM | POA: Diagnosis not present

## 2018-11-12 DIAGNOSIS — E782 Mixed hyperlipidemia: Secondary | ICD-10-CM | POA: Diagnosis not present

## 2018-11-12 DIAGNOSIS — Z09 Encounter for follow-up examination after completed treatment for conditions other than malignant neoplasm: Secondary | ICD-10-CM | POA: Diagnosis not present

## 2018-11-12 DIAGNOSIS — G300 Alzheimer's disease with early onset: Secondary | ICD-10-CM | POA: Diagnosis not present

## 2018-11-12 DIAGNOSIS — N183 Chronic kidney disease, stage 3 (moderate): Secondary | ICD-10-CM | POA: Diagnosis not present

## 2018-11-12 DIAGNOSIS — F0280 Dementia in other diseases classified elsewhere without behavioral disturbance: Secondary | ICD-10-CM | POA: Diagnosis not present

## 2018-11-12 DIAGNOSIS — K219 Gastro-esophageal reflux disease without esophagitis: Secondary | ICD-10-CM | POA: Diagnosis not present

## 2018-11-12 DIAGNOSIS — E1122 Type 2 diabetes mellitus with diabetic chronic kidney disease: Secondary | ICD-10-CM | POA: Diagnosis not present

## 2018-11-12 DIAGNOSIS — I129 Hypertensive chronic kidney disease with stage 1 through stage 4 chronic kidney disease, or unspecified chronic kidney disease: Secondary | ICD-10-CM | POA: Diagnosis not present

## 2018-11-12 DIAGNOSIS — I240 Acute coronary thrombosis not resulting in myocardial infarction: Secondary | ICD-10-CM | POA: Diagnosis not present

## 2018-11-26 DIAGNOSIS — R112 Nausea with vomiting, unspecified: Secondary | ICD-10-CM | POA: Diagnosis not present

## 2018-11-26 DIAGNOSIS — G300 Alzheimer's disease with early onset: Secondary | ICD-10-CM | POA: Diagnosis not present

## 2018-11-26 DIAGNOSIS — K219 Gastro-esophageal reflux disease without esophagitis: Secondary | ICD-10-CM | POA: Diagnosis not present

## 2018-11-26 DIAGNOSIS — N183 Chronic kidney disease, stage 3 (moderate): Secondary | ICD-10-CM | POA: Diagnosis not present

## 2018-11-26 DIAGNOSIS — Z79899 Other long term (current) drug therapy: Secondary | ICD-10-CM | POA: Diagnosis not present

## 2018-11-26 DIAGNOSIS — I129 Hypertensive chronic kidney disease with stage 1 through stage 4 chronic kidney disease, or unspecified chronic kidney disease: Secondary | ICD-10-CM | POA: Diagnosis not present

## 2018-11-26 DIAGNOSIS — E782 Mixed hyperlipidemia: Secondary | ICD-10-CM | POA: Diagnosis not present

## 2018-11-26 DIAGNOSIS — R634 Abnormal weight loss: Secondary | ICD-10-CM | POA: Diagnosis not present

## 2018-11-26 DIAGNOSIS — D509 Iron deficiency anemia, unspecified: Secondary | ICD-10-CM | POA: Diagnosis not present

## 2018-11-26 DIAGNOSIS — R5381 Other malaise: Secondary | ICD-10-CM | POA: Diagnosis not present

## 2018-11-26 DIAGNOSIS — R5383 Other fatigue: Secondary | ICD-10-CM | POA: Diagnosis not present

## 2018-11-26 DIAGNOSIS — I513 Intracardiac thrombosis, not elsewhere classified: Secondary | ICD-10-CM | POA: Diagnosis not present

## 2018-11-26 DIAGNOSIS — E119 Type 2 diabetes mellitus without complications: Secondary | ICD-10-CM | POA: Diagnosis not present

## 2018-12-24 DIAGNOSIS — R634 Abnormal weight loss: Secondary | ICD-10-CM | POA: Diagnosis not present

## 2018-12-24 DIAGNOSIS — G300 Alzheimer's disease with early onset: Secondary | ICD-10-CM | POA: Diagnosis not present

## 2018-12-24 DIAGNOSIS — Z23 Encounter for immunization: Secondary | ICD-10-CM | POA: Diagnosis not present

## 2018-12-24 DIAGNOSIS — Z0000 Encounter for general adult medical examination without abnormal findings: Secondary | ICD-10-CM | POA: Diagnosis not present

## 2018-12-24 DIAGNOSIS — R109 Unspecified abdominal pain: Secondary | ICD-10-CM | POA: Diagnosis not present

## 2018-12-24 DIAGNOSIS — Z79899 Other long term (current) drug therapy: Secondary | ICD-10-CM | POA: Diagnosis not present

## 2018-12-24 DIAGNOSIS — F0280 Dementia in other diseases classified elsewhere without behavioral disturbance: Secondary | ICD-10-CM | POA: Diagnosis not present

## 2018-12-24 DIAGNOSIS — E119 Type 2 diabetes mellitus without complications: Secondary | ICD-10-CM | POA: Diagnosis not present

## 2019-01-07 DIAGNOSIS — Z23 Encounter for immunization: Secondary | ICD-10-CM | POA: Diagnosis not present

## 2019-01-12 ENCOUNTER — Inpatient Hospital Stay: Admission: AD | Admit: 2019-01-12 | Payer: PPO | Source: Other Acute Inpatient Hospital | Admitting: Internal Medicine

## 2019-01-12 DIAGNOSIS — N183 Chronic kidney disease, stage 3 unspecified: Secondary | ICD-10-CM | POA: Diagnosis not present

## 2019-01-12 DIAGNOSIS — R58 Hemorrhage, not elsewhere classified: Secondary | ICD-10-CM | POA: Diagnosis not present

## 2019-01-12 DIAGNOSIS — F05 Delirium due to known physiological condition: Secondary | ICD-10-CM | POA: Diagnosis not present

## 2019-01-12 DIAGNOSIS — K254 Chronic or unspecified gastric ulcer with hemorrhage: Secondary | ICD-10-CM | POA: Diagnosis not present

## 2019-01-12 DIAGNOSIS — R5381 Other malaise: Secondary | ICD-10-CM | POA: Diagnosis not present

## 2019-01-12 DIAGNOSIS — G300 Alzheimer's disease with early onset: Secondary | ICD-10-CM | POA: Diagnosis not present

## 2019-01-12 DIAGNOSIS — F23 Brief psychotic disorder: Secondary | ICD-10-CM | POA: Diagnosis not present

## 2019-01-12 DIAGNOSIS — F0280 Dementia in other diseases classified elsewhere without behavioral disturbance: Secondary | ICD-10-CM | POA: Diagnosis not present

## 2019-01-12 DIAGNOSIS — K449 Diaphragmatic hernia without obstruction or gangrene: Secondary | ICD-10-CM | POA: Diagnosis not present

## 2019-01-12 DIAGNOSIS — K219 Gastro-esophageal reflux disease without esophagitis: Secondary | ICD-10-CM | POA: Diagnosis not present

## 2019-01-12 DIAGNOSIS — Z66 Do not resuscitate: Secondary | ICD-10-CM | POA: Diagnosis not present

## 2019-01-12 DIAGNOSIS — R079 Chest pain, unspecified: Secondary | ICD-10-CM | POA: Diagnosis not present

## 2019-01-12 DIAGNOSIS — K2210 Ulcer of esophagus without bleeding: Secondary | ICD-10-CM | POA: Diagnosis not present

## 2019-01-12 DIAGNOSIS — K226 Gastro-esophageal laceration-hemorrhage syndrome: Secondary | ICD-10-CM | POA: Diagnosis not present

## 2019-01-12 DIAGNOSIS — K920 Hematemesis: Secondary | ICD-10-CM | POA: Diagnosis not present

## 2019-01-12 DIAGNOSIS — K2980 Duodenitis without bleeding: Secondary | ICD-10-CM | POA: Diagnosis not present

## 2019-01-12 DIAGNOSIS — E785 Hyperlipidemia, unspecified: Secondary | ICD-10-CM | POA: Diagnosis not present

## 2019-01-12 DIAGNOSIS — K2981 Duodenitis with bleeding: Secondary | ICD-10-CM | POA: Diagnosis not present

## 2019-01-12 DIAGNOSIS — K2950 Unspecified chronic gastritis without bleeding: Secondary | ICD-10-CM | POA: Diagnosis not present

## 2019-01-12 DIAGNOSIS — K259 Gastric ulcer, unspecified as acute or chronic, without hemorrhage or perforation: Secondary | ICD-10-CM | POA: Diagnosis not present

## 2019-01-12 DIAGNOSIS — R451 Restlessness and agitation: Secondary | ICD-10-CM | POA: Diagnosis not present

## 2019-01-12 DIAGNOSIS — G4710 Hypersomnia, unspecified: Secondary | ICD-10-CM | POA: Diagnosis not present

## 2019-01-12 DIAGNOSIS — I130 Hypertensive heart and chronic kidney disease with heart failure and stage 1 through stage 4 chronic kidney disease, or unspecified chronic kidney disease: Secondary | ICD-10-CM | POA: Diagnosis not present

## 2019-01-12 DIAGNOSIS — I10 Essential (primary) hypertension: Secondary | ICD-10-CM | POA: Diagnosis not present

## 2019-01-12 DIAGNOSIS — R001 Bradycardia, unspecified: Secondary | ICD-10-CM | POA: Diagnosis not present

## 2019-01-12 DIAGNOSIS — E119 Type 2 diabetes mellitus without complications: Secondary | ICD-10-CM | POA: Diagnosis not present

## 2019-01-12 DIAGNOSIS — R7989 Other specified abnormal findings of blood chemistry: Secondary | ICD-10-CM | POA: Diagnosis not present

## 2019-01-12 DIAGNOSIS — F0281 Dementia in other diseases classified elsewhere with behavioral disturbance: Secondary | ICD-10-CM | POA: Diagnosis not present

## 2019-01-12 DIAGNOSIS — D62 Acute posthemorrhagic anemia: Secondary | ICD-10-CM | POA: Diagnosis not present

## 2019-01-12 DIAGNOSIS — I2510 Atherosclerotic heart disease of native coronary artery without angina pectoris: Secondary | ICD-10-CM | POA: Diagnosis not present

## 2019-01-12 DIAGNOSIS — K2930 Chronic superficial gastritis without bleeding: Secondary | ICD-10-CM | POA: Diagnosis not present

## 2019-01-12 DIAGNOSIS — R0902 Hypoxemia: Secondary | ICD-10-CM | POA: Diagnosis not present

## 2019-01-12 DIAGNOSIS — R410 Disorientation, unspecified: Secondary | ICD-10-CM | POA: Diagnosis not present

## 2019-01-12 DIAGNOSIS — M1030 Gout due to renal impairment, unspecified site: Secondary | ICD-10-CM | POA: Diagnosis not present

## 2019-01-12 DIAGNOSIS — R000 Tachycardia, unspecified: Secondary | ICD-10-CM | POA: Diagnosis not present

## 2019-01-12 DIAGNOSIS — Z03818 Encounter for observation for suspected exposure to other biological agents ruled out: Secondary | ICD-10-CM | POA: Diagnosis not present

## 2019-01-12 DIAGNOSIS — D649 Anemia, unspecified: Secondary | ICD-10-CM | POA: Diagnosis not present

## 2019-01-12 DIAGNOSIS — K922 Gastrointestinal hemorrhage, unspecified: Secondary | ICD-10-CM | POA: Diagnosis not present

## 2019-01-12 DIAGNOSIS — I252 Old myocardial infarction: Secondary | ICD-10-CM | POA: Diagnosis not present

## 2019-01-12 DIAGNOSIS — I214 Non-ST elevation (NSTEMI) myocardial infarction: Secondary | ICD-10-CM | POA: Diagnosis not present

## 2019-01-12 DIAGNOSIS — F0390 Unspecified dementia without behavioral disturbance: Secondary | ICD-10-CM | POA: Diagnosis not present

## 2019-01-12 DIAGNOSIS — G9340 Encephalopathy, unspecified: Secondary | ICD-10-CM | POA: Diagnosis not present

## 2019-01-12 DIAGNOSIS — I959 Hypotension, unspecified: Secondary | ICD-10-CM | POA: Diagnosis not present

## 2019-01-12 DIAGNOSIS — I5022 Chronic systolic (congestive) heart failure: Secondary | ICD-10-CM | POA: Diagnosis not present

## 2019-01-12 DIAGNOSIS — G309 Alzheimer's disease, unspecified: Secondary | ICD-10-CM | POA: Diagnosis not present

## 2019-01-12 DIAGNOSIS — E1122 Type 2 diabetes mellitus with diabetic chronic kidney disease: Secondary | ICD-10-CM | POA: Diagnosis not present

## 2019-01-12 DIAGNOSIS — Z781 Physical restraint status: Secondary | ICD-10-CM | POA: Diagnosis not present

## 2019-01-12 DIAGNOSIS — Z91013 Allergy to seafood: Secondary | ICD-10-CM | POA: Diagnosis not present

## 2019-01-12 DIAGNOSIS — R0789 Other chest pain: Secondary | ICD-10-CM | POA: Diagnosis not present

## 2019-01-12 DIAGNOSIS — I358 Other nonrheumatic aortic valve disorders: Secondary | ICD-10-CM | POA: Diagnosis not present

## 2019-01-12 DIAGNOSIS — R4182 Altered mental status, unspecified: Secondary | ICD-10-CM | POA: Diagnosis not present

## 2019-01-12 DIAGNOSIS — D509 Iron deficiency anemia, unspecified: Secondary | ICD-10-CM | POA: Diagnosis not present

## 2019-01-12 DIAGNOSIS — D500 Iron deficiency anemia secondary to blood loss (chronic): Secondary | ICD-10-CM | POA: Diagnosis not present

## 2019-01-12 DIAGNOSIS — R1110 Vomiting, unspecified: Secondary | ICD-10-CM | POA: Diagnosis not present

## 2019-01-13 DIAGNOSIS — I10 Essential (primary) hypertension: Secondary | ICD-10-CM | POA: Diagnosis not present

## 2019-01-13 DIAGNOSIS — E119 Type 2 diabetes mellitus without complications: Secondary | ICD-10-CM | POA: Diagnosis not present

## 2019-01-13 DIAGNOSIS — F0390 Unspecified dementia without behavioral disturbance: Secondary | ICD-10-CM | POA: Diagnosis not present

## 2019-01-13 DIAGNOSIS — I2510 Atherosclerotic heart disease of native coronary artery without angina pectoris: Secondary | ICD-10-CM | POA: Diagnosis not present

## 2019-01-13 DIAGNOSIS — Z66 Do not resuscitate: Secondary | ICD-10-CM | POA: Diagnosis not present

## 2019-01-13 DIAGNOSIS — G309 Alzheimer's disease, unspecified: Secondary | ICD-10-CM | POA: Diagnosis not present

## 2019-01-13 DIAGNOSIS — F0280 Dementia in other diseases classified elsewhere without behavioral disturbance: Secondary | ICD-10-CM | POA: Diagnosis not present

## 2019-01-13 DIAGNOSIS — I214 Non-ST elevation (NSTEMI) myocardial infarction: Secondary | ICD-10-CM | POA: Diagnosis not present

## 2019-01-13 DIAGNOSIS — K922 Gastrointestinal hemorrhage, unspecified: Secondary | ICD-10-CM | POA: Diagnosis not present

## 2019-01-13 DIAGNOSIS — D62 Acute posthemorrhagic anemia: Secondary | ICD-10-CM | POA: Diagnosis not present

## 2019-01-13 DIAGNOSIS — D649 Anemia, unspecified: Secondary | ICD-10-CM | POA: Diagnosis not present

## 2019-01-13 DIAGNOSIS — E785 Hyperlipidemia, unspecified: Secondary | ICD-10-CM | POA: Diagnosis not present

## 2019-01-13 DIAGNOSIS — R4182 Altered mental status, unspecified: Secondary | ICD-10-CM | POA: Diagnosis not present

## 2019-01-14 DIAGNOSIS — I2510 Atherosclerotic heart disease of native coronary artery without angina pectoris: Secondary | ICD-10-CM | POA: Diagnosis not present

## 2019-01-14 DIAGNOSIS — E119 Type 2 diabetes mellitus without complications: Secondary | ICD-10-CM | POA: Diagnosis not present

## 2019-01-14 DIAGNOSIS — F0280 Dementia in other diseases classified elsewhere without behavioral disturbance: Secondary | ICD-10-CM | POA: Diagnosis not present

## 2019-01-14 DIAGNOSIS — I10 Essential (primary) hypertension: Secondary | ICD-10-CM | POA: Diagnosis not present

## 2019-01-14 DIAGNOSIS — K922 Gastrointestinal hemorrhage, unspecified: Secondary | ICD-10-CM | POA: Diagnosis not present

## 2019-01-14 DIAGNOSIS — G309 Alzheimer's disease, unspecified: Secondary | ICD-10-CM | POA: Diagnosis not present

## 2019-01-14 DIAGNOSIS — D62 Acute posthemorrhagic anemia: Secondary | ICD-10-CM | POA: Diagnosis not present

## 2019-01-14 DIAGNOSIS — R410 Disorientation, unspecified: Secondary | ICD-10-CM | POA: Diagnosis not present

## 2019-01-15 DIAGNOSIS — I2510 Atherosclerotic heart disease of native coronary artery without angina pectoris: Secondary | ICD-10-CM | POA: Diagnosis not present

## 2019-01-15 DIAGNOSIS — F0280 Dementia in other diseases classified elsewhere without behavioral disturbance: Secondary | ICD-10-CM | POA: Diagnosis not present

## 2019-01-15 DIAGNOSIS — D62 Acute posthemorrhagic anemia: Secondary | ICD-10-CM | POA: Diagnosis not present

## 2019-01-15 DIAGNOSIS — R410 Disorientation, unspecified: Secondary | ICD-10-CM | POA: Diagnosis not present

## 2019-01-15 DIAGNOSIS — I10 Essential (primary) hypertension: Secondary | ICD-10-CM | POA: Diagnosis not present

## 2019-01-15 DIAGNOSIS — K922 Gastrointestinal hemorrhage, unspecified: Secondary | ICD-10-CM | POA: Diagnosis not present

## 2019-01-15 DIAGNOSIS — E119 Type 2 diabetes mellitus without complications: Secondary | ICD-10-CM | POA: Diagnosis not present

## 2019-01-15 DIAGNOSIS — G309 Alzheimer's disease, unspecified: Secondary | ICD-10-CM | POA: Diagnosis not present

## 2019-01-16 DIAGNOSIS — R410 Disorientation, unspecified: Secondary | ICD-10-CM | POA: Diagnosis not present

## 2019-01-16 DIAGNOSIS — K922 Gastrointestinal hemorrhage, unspecified: Secondary | ICD-10-CM | POA: Diagnosis not present

## 2019-04-20 DEATH — deceased
